# Patient Record
Sex: Male | Born: 1955 | Race: Black or African American | Hispanic: No | Marital: Single | Smoking: Former smoker
Health system: Southern US, Community
[De-identification: ages and names within clinical notes are randomized; demographics above are authoritative.]

## PROBLEM LIST (undated history)

## (undated) DIAGNOSIS — K8020 Calculus of gallbladder without cholecystitis without obstruction: Secondary | ICD-10-CM

## (undated) DIAGNOSIS — Z87442 Personal history of urinary calculi: Secondary | ICD-10-CM

## (undated) HISTORY — PX: CYSTOSCOPY: SUR368

## (undated) HISTORY — PX: BACK SURGERY: SHX140

## (undated) HISTORY — PX: CHOLECYSTECTOMY: SHX55

## (undated) HISTORY — PX: SMALL INTESTINE SURGERY: SHX150

---

## 2001-01-20 ENCOUNTER — Inpatient Hospital Stay (HOSPITAL_COMMUNITY): Admission: RE | Admit: 2001-01-20 | Discharge: 2001-01-22 | Payer: Self-pay | Admitting: Neurosurgery

## 2001-01-20 ENCOUNTER — Encounter: Payer: Self-pay | Admitting: Neurosurgery

## 2010-05-26 ENCOUNTER — Encounter: Admission: RE | Admit: 2010-05-26 | Discharge: 2010-05-26 | Payer: Self-pay | Admitting: Internal Medicine

## 2011-01-24 NOTE — H&P (Signed)
[place]. [facility]  Patient:    Curtis Craig, Curtis Craig                          MRN: [id number] Adm. Date:  [id number] Attending:  Coletta Memos                         History and Physical  ADMISSION DIAGNOSIS: Herniated disk at L2-3, and low back pain.  HISTORY OF PRESENT ILLNESS: [name] is a 55 year old right-handed gentleman who works as a Journalist, newspaper of [employer], who has had low back pain for the last three weeks.  The pain does not extend into his legs.  He has had pain similar to this about four to fives years ago.  He does have a long history of back pain.  He has had no bowel or bladder or sexual dysfunction.  There are no problems with strength.  Pain is described as very sharp, making it hard for him to get up at times.  Sitting with his back reclined against a vibrator is the only thing he has found to make himself semi-comfortable.  Vicodin tablets he has taken recently have only slowed him and make him feel out of sorts.  REVIEW OF SYSTEMS: Positive only for back pain.  He denies constitutional, eye, ear, nose, throat, mouth, cardiovascular, respiratory, gastrointestinal, genitourinary, skin, neurologic, psychiatric, endocrine, hematologic, or allergic problems.  He has no numbness or tingling in the lower extremities.  PAST SURGICAL HISTORY:  1. He underwent cholecystectomy in 2001.  2. Bowel obstruction surgery in 2000.  ALLERGIES: No known drug allergies.  CURRENT MEDICATIONS: None.  FAMILY HISTORY: Mother and father both deceased.  No medical problems noted in the family.  SOCIAL HISTORY: He says he smokes approximately once per week and drinks alcohol socially.  Does not use illicit drugs.  PHYSICAL EXAMINATION:  VITAL SIGNS: Height 5 feet 8 inches.  Weight 172 pounds.  Pulse 60.  GENERAL: Alert and oriented x 4.  Answers all questions appropriately. Memory, language, attention span, fund of knowledge are  normal.  He is well-kempt and in mild distress.  NEUROLOGIC: PERRL. EOMI.  Normal funduscopic examination.  Injected sclera, left eye; tearing in that eye also.  Symmetric facial sensation and symmetric facial movement.  Hearing intact to finger rub bilaterally.  Uvula elevates in the midline.  Shoulder shrug normal.  Tongue protrudes in the midline. Strength 5/5 in upper and lower extremities bilaterally.  Proprioception and pinprick normal in upper and lower extremities.  No patella reflex on the right, 2+ on the left.  Ankle jerk 2+ on the left, trace on the right. Reflexes 2+ at the biceps, triceps, and brachial radialis.  Toes are downgoing to plantar stimulation.  No clonus is appreciated.  CHEST: Lung fields are clear.  HEART: Regular rate and rhythm.  No murmurs or rubs.  Pulses good at wrists and feet bilaterally.  LABORATORY DATA: MRI shows a herniated disk at L2-3 along with desiccated disk at L2-3 and slight disk degeneration with free fragment extending rostrally behind the body of L2, most of this being on the right side; free fragment is in the middle below the clonus.  No other problems in the lumbar spine.  ASSESSMENT/PLAN: [name] is a 55 year old gentleman, who I think has back pain secondary to disk herniation.  We did discuss options including doing nothing, which I do not think  is reasonable as he is not able to work and he has a considerable amount of pain.  We could try epidural steroids, which also will not work as he has a large free fragment of disk which steroids would not do anything to improve.  Physical therapy could be tried, which I do not think is too useful because he is in so much pain right now.  We therefore did discuss surgery.  We discussed diskectomy and laminectomy.  He could be weak in his legs, bowel and bladder function could be disrupted, sexual function could be disrupted, or he may have a recurrence of the disk herniation.   He understands and would like to proceed with surgery. DD:  01/20/01 TD:  01/21/01 Job: [id number]

## 2011-01-24 NOTE — Op Note (Signed)
[place]. [facility]  Patient:    Curtis Craig, Curtis Craig                          MRN: [id number] Proc. Date: 01/20/01 Adm. Date:  [id number] Disc. Date: [id number] Attending:  Coletta Memos                           Operative Report  PREOPERATIVE DIAGNOSIS:  Displaced disk L2-3 and low back pain.  POSTOPERATIVE DIAGNOSIS:  Displaced disk L2-3 and low back pain.  OPERATION PERFORMED:  L2-3 semihemilaminectomy and diskectomy with microdissection.  COMPLICATIONS:  None.  SURGEON:  Kyle L. Franky Macho, M.D.  ASSISTANT:  None.  ANESTHESIA:  General.  INDICATIONS FOR PROCEDURE:  The patient is a 55 year old right-handed gentleman with a large herniated disk at L2-3 and low back pain.  I believe the disk and its free fragment is the reason for his low back pain.  DESCRIPTION OF PROCEDURE:  Mr. Irani was brought to the operating room intubated and placed under general anesthesia without difficulty.  I placed a spinal needle for localization and an x-ray was taken.  The x-ray appeared to be pointing to the spinous process of L2. Using that as a guide, I infiltrated 0.5% lidocaine and 1:200,000 strength 10 cc into the paraspinous soft tissue and underneath the skin.  I then draped the patient in sterile fashion.  I made a skin incision with a #10 blade and took this down to the thoracolumbar fascia.  I exposed the lamina of what I believed to be L2 and 3.  I took an x-ray that showed that my probe was placed inferior to the lamina of L1. Moving down one disk space I then proceeded with a semihemilaminectomy and diskectomy at L2.  This was done with a high speed air drill.  After exposing the thecal sac, I brought the microscope into the operative field and I then used it for microdissection of the L3 nerve root and the thecal sac.  I opened the disk space with a #15 blade and removed disk material from the disk space.  After that I was then able to probe underneath the  thecal sac with blunt hooks and removed a large free fragment of disk.  After that was done, I inspected to make sure that there was no loose disk material in the disk space after using Epstein curets and pituitary rongeurs.  I also inspected out in the neural foramen and again felt that there was no compression of the root. I irrigated the wound with saline.  I then closed the wound in layered fashion using Vicryl sutures.  Subcutaneous tissues and the thoracolumbar fascia were reapproximated with Vicryl sutures.  Dermabond was used for a sterile dressing.  The patient was then rolled supine, extubated moving all extremities well. DD:  01/22/01 TD:  01/25/01 Job: [id number]

## 2015-08-09 ENCOUNTER — Encounter (HOSPITAL_COMMUNITY)
Admission: RE | Admit: 2015-08-09 | Discharge: 2015-08-09 | Disposition: A | Discharge status: Home / Self Care | Payer: PRIVATE HEALTH INSURANCE | Source: Ambulatory Visit | Attending: General Surgery | Admitting: General Surgery

## 2015-08-09 ENCOUNTER — Encounter (HOSPITAL_COMMUNITY): Payer: Self-pay

## 2015-08-09 DIAGNOSIS — Z01818 Encounter for other preprocedural examination: Secondary | ICD-10-CM | POA: Insufficient documentation

## 2015-08-09 DIAGNOSIS — K4090 Unilateral inguinal hernia, without obstruction or gangrene, not specified as recurrent: Secondary | ICD-10-CM | POA: Diagnosis not present

## 2015-08-09 HISTORY — DX: Personal history of urinary calculi: Z87.442

## 2015-08-09 LAB — CBC WITH DIFFERENTIAL/PLATELET
BASOS ABS: 0 10*3/uL (ref 0.0–0.1)
Basophils Relative: 1 %
EOS ABS: 0.3 10*3/uL (ref 0.0–0.7)
EOS PCT: 4 %
HCT: 37 % — ABNORMAL LOW (ref 39.0–52.0)
Hemoglobin: 12 g/dL — ABNORMAL LOW (ref 13.0–17.0)
Lymphocytes Relative: 39 %
Lymphs Abs: 2.6 10*3/uL (ref 0.7–4.0)
MCH: 28.2 pg (ref 26.0–34.0)
MCHC: 32.4 g/dL (ref 30.0–36.0)
MCV: 86.9 fL (ref 78.0–100.0)
Monocytes Absolute: 0.5 10*3/uL (ref 0.1–1.0)
Monocytes Relative: 8 %
Neutro Abs: 3.3 10*3/uL (ref 1.7–7.7)
Neutrophils Relative %: 48 %
PLATELETS: 336 10*3/uL (ref 150–400)
RBC: 4.26 MIL/uL (ref 4.22–5.81)
RDW: 13.2 % (ref 11.5–15.5)
WBC: 6.7 10*3/uL (ref 4.0–10.5)

## 2015-08-09 LAB — BASIC METABOLIC PANEL
Anion gap: 7 (ref 5–15)
BUN: 10 mg/dL (ref 6–20)
CALCIUM: 9.3 mg/dL (ref 8.9–10.3)
CO2: 25 mmol/L (ref 22–32)
CREATININE: 0.95 mg/dL (ref 0.61–1.24)
Chloride: 108 mmol/L (ref 101–111)
GFR calc Af Amer: >60 mL/min (ref >60)
Glucose, Bld: 89 mg/dL (ref 65–99)
Potassium: 4.1 mmol/L (ref 3.5–5.1)
SODIUM: 140 mmol/L (ref 135–145)

## 2015-08-09 NOTE — Pre-Procedure Instructions (Signed)
Patient given information to sign up for my chart at home. 

## 2015-08-09 NOTE — Patient Instructions (Signed)
Lemon Sternberg  08/09/2015     @   Your procedure is scheduled on  08/13/2015   Report to [facility] at  725  A.M.  Call this number if you have problems the morning of surgery:  [phone]   Remember:  Do not eat food or drink liquids after midnight.  Take these medicines the morning of surgery with A SIP OF WATER  none   Do not wear jewelry, make-up or nail polish.  Do not wear lotions, powders, or perfumes.  You may wear deodorant.  Do not shave 48 hours prior to surgery.  Men may shave face and neck.  Do not bring valuables to the hospital.  [facility] is not responsible for any belongings or valuables.  Contacts, dentures or bridgework may not be worn into surgery.  Leave your suitcase in the car.  After surgery it may be brought to your room.  For patients admitted to the hospital, discharge time will be determined by your treatment team.  Patients discharged the day of surgery will not be allowed to drive home.   Name and phone number of your driver:   family Special instructions:  none  Please read over the following fact sheets that you were given. Pain Booklet, Coughing and Deep Breathing, Surgical Site Infection Prevention, Anesthesia Post-op Instructions and Care and Recovery After Surgery      Hernia, Adult A hernia is the bulging of an organ or tissue through a weak spot in the muscles of the abdomen (abdominal wall). Hernias develop most often near the navel or groin. There are many kinds of hernias. Common kinds include:  Femoral hernia. This kind of hernia develops under the groin in the upper thigh area.  Inguinal hernia. This kind of hernia develops in the groin or scrotum.  Umbilical hernia. This kind of hernia develops near the navel.  Hiatal hernia. This kind of hernia causes part of the stomach to be pushed up into the chest.  Incisional hernia. This kind of hernia bulges through a scar from an abdominal  surgery. CAUSES This condition may be caused by:  Heavy lifting.  Coughing over a long period of time.  Straining to have a bowel movement.  An incision made during an abdominal surgery.  A birth defect (congenital defect).  Excess weight or obesity.  Smoking.  Poor nutrition.  Cystic fibrosis.  Excess fluid in the abdomen.  Undescended testicles. SYMPTOMS Symptoms of a hernia include:  A lump on the abdomen. This is the first sign of a hernia. The lump may become more obvious with standing, straining, or coughing. It may get bigger over time if it is not treated or if the condition causing it is not treated.  Pain. A hernia is usually painless, but it may become painful over time if treatment is delayed. The pain is usually dull and may get worse with standing or lifting heavy objects. Sometimes a hernia gets tightly squeezed in the weak spot (strangulated) or stuck there (incarcerated) and causes additional symptoms. These symptoms may include:  Vomiting.  Nausea.  Constipation.  Irritability. DIAGNOSIS A hernia may be diagnosed with:  A physical exam. During the exam your health care provider may ask you to cough or to make a specific movement, because a hernia is usually more visible when you move.  Imaging tests. These can include:  X-rays.  Ultrasound.  CT scan. TREATMENT A hernia that is small and painless may not  need to be treated. A hernia that is large or painful may be treated with surgery. Inguinal hernias may be treated with surgery to prevent incarceration or strangulation. Strangulated hernias are always treated with surgery, because lack of blood to the trapped organ or tissue can cause it to die. Surgery to treat a hernia involves pushing the bulge back into place and repairing the weak part of the abdomen. HOME CARE INSTRUCTIONS  Avoid straining.  Do not lift anything heavier than 10 lb (4.5 kg).  Lift with your leg muscles, not your  back muscles. This helps avoid strain.  When coughing, try to cough gently.  Prevent constipation. Constipation leads to straining with bowel movements, which can make a hernia worse or cause a hernia repair to break down. You can prevent constipation by:  Eating a high-fiber diet that includes plenty of fruits and vegetables.  Drinking enough fluids to keep your urine clear or pale yellow. Aim to drink 6-8 glasses of water per day.  Using a stool softener as directed by your health care provider.  Lose weight, if you are overweight.  Do not use any tobacco products, including cigarettes, chewing tobacco, or electronic cigarettes. If you need help quitting, ask your health care provider.  Keep all follow-up visits as directed by your health care provider. This is important. Your health care provider may need to monitor your condition. SEEK MEDICAL CARE IF:  You have swelling, redness, and pain in the affected area.  Your bowel habits change. SEEK IMMEDIATE MEDICAL CARE IF:  You have a fever.  You have abdominal pain that is getting worse.  You feel nauseous or you vomit.  You cannot push the hernia back in place by gently pressing on it while you are lying down.  The hernia:  Changes in shape or size.  Is stuck outside the abdomen.  Becomes discolored.  Feels hard or tender.   This information is not intended to replace advice given to you by your health care provider. Make sure you discuss any questions you have with your health care provider.   Document Released: 08/25/2005 Document Revised: 09/15/2014 Document Reviewed: 07/05/2014 Elsevier Interactive Patient Education 2016 [employer] PATIENT INSTRUCTIONS POST-ANESTHESIA  IMMEDIATELY FOLLOWING SURGERY:  Do not drive or operate machinery for the first twenty four hours after surgery.  Do not make any important decisions for twenty four hours after surgery or while taking narcotic pain medications or sedatives.   If you develop intractable nausea and vomiting or a severe headache please notify your doctor immediately.  FOLLOW-UP:  Please make an appointment with your surgeon as instructed. You do not need to follow up with anesthesia unless specifically instructed to do so.  WOUND CARE INSTRUCTIONS (if applicable):  Keep a dry clean dressing on the anesthesia/puncture wound site if there is drainage.  Once the wound has quit draining you may leave it open to air.  Generally you should leave the bandage intact for twenty four hours unless there is drainage.  If the epidural site drains for more than 36-48 hours please call the anesthesia department.  QUESTIONS?:  Please feel free to call your physician or the hospital operator if you have any questions, and they will be happy to assist you.

## 2015-08-10 NOTE — H&P (Signed)
  NTS SOAP Note  Vital Signs:  Vitals as of: 08/09/2015: Systolic 157: Diastolic 88: Heart Rate 65: Temp 7F: Height 855ft 9in: Weight 187Lbs 0 Ounces: Pain Level 10: BMI 27.61  BMI : 27.61 kg/m2  Subjective: This 59 year old male presents for of a right inguinal hernia.  Has had pain in that region for the past few weeks, is worsening.  Made worse with straining.  Review of Symptoms:  Constitutional:unremarkable   Head:unremarkable Eyes:unremarkable   Nose/Mouth/Throat:unremarkable Cardiovascular:  unremarkable Respiratory:unremarkable Gastrointestinal:  unremarkable   Genitourinary:unremarkable   Musculoskeletal:unremarkable Skin:unremarkable Hematolgic/Lymphatic:unremarkable   Allergic/Immunologic:unremarkable   Past Medical History:  Reviewed  Past Medical History  Surgical History: abdominal surgery, cholecystectomy, appendectomy Medical Problems: none Allergies: nkda Medications: hydrocodone   Social History:Reviewed  Social History  Preferred Language: English Race:  Black or African American Ethnicity: Not Hispanic / Latino Age: [age] year Marital Status:  S Alcohol: rarely   Smoking Status: Never smoker reviewed on 08/09/2015 Functional Status reviewed on 08/09/2015 ------------------------------------------------ Bathing: Normal Cooking: Normal Dressing: Normal Driving: Normal Eating: Normal Managing Meds: Normal Oral Care: Normal Shopping: Normal Toileting: Normal Transferring: Normal Walking: Normal Cognitive Status reviewed on 08/09/2015 ------------------------------------------------ Attention: Normal Decision Making: Normal Language: Normal Memory: Normal Motor: Normal Perception: Normal Problem Solving: Normal Visual and Spatial: Normal   Family History:Reviewed  Family Health History Family History is Unknown    Objective Information: General:Well appearing, well nourished in no  distress. Heart:RRR, no murmur or gallop.  Normal S1, S2.  No S3, S4.  Lungs:  CTA bilaterally, no wheezes, rhonchi, rales.  Breathing unlabored. Abdomen:Soft, NT/ND, no HSM, no masses.  Small reducible right inguinal hernia.  No left inguinal hernia. WU:[id number]:unremarkable    Assessment:Right inguinal hernia  Diagnoses: 550.90  K40.90 Inguinal hernia (Unilateral inguinal hernia, without obstruction or gangrene, not specified as recurrent)  Procedures: [id number] - OFFICE OUTPATIENT NEW 30 MINUTES    Plan:  Scheduled for right inguinal herniorrhaphy with mesh on 08/13/15.   Patient Education:Alternative treatments to surgery were discussed with patient (and family).  Risks and benefits  of procedure including bleedking, infection, mesh use, and recurrence of the hernia were fully explained to the patient (and family) who gave informed consent. Patient/family questions were addressed.  Follow-up:Pending Surgery

## 2015-08-13 ENCOUNTER — Ambulatory Visit (HOSPITAL_COMMUNITY): Payer: PRIVATE HEALTH INSURANCE | Admitting: Anesthesiology

## 2015-08-13 ENCOUNTER — Ambulatory Visit (HOSPITAL_COMMUNITY)
Admission: RE | Admit: 2015-08-13 | Discharge: 2015-08-13 | Disposition: A | Discharge status: Home / Self Care | Payer: PRIVATE HEALTH INSURANCE | Source: Ambulatory Visit | Attending: General Surgery | Admitting: General Surgery

## 2015-08-13 ENCOUNTER — Encounter (HOSPITAL_COMMUNITY)
Admission: RE | Disposition: A | Discharge status: Home / Self Care | Payer: Self-pay | Source: Ambulatory Visit | Attending: General Surgery

## 2015-08-13 ENCOUNTER — Encounter (HOSPITAL_COMMUNITY): Payer: Self-pay | Admitting: *Deleted

## 2015-08-13 DIAGNOSIS — K4090 Unilateral inguinal hernia, without obstruction or gangrene, not specified as recurrent: Secondary | ICD-10-CM | POA: Diagnosis present

## 2015-08-13 HISTORY — DX: Calculus of gallbladder without cholecystitis without obstruction: K80.20

## 2015-08-13 HISTORY — PX: INSERTION OF MESH: SHX5868

## 2015-08-13 HISTORY — PX: INGUINAL HERNIA REPAIR: SHX194

## 2015-08-13 SURGERY — REPAIR, HERNIA, INGUINAL, ADULT
Anesthesia: General | Laterality: Right

## 2015-08-13 MED ORDER — LACTATED RINGERS IV SOLN
INTRAVENOUS | Status: DC
  Administered 2015-08-13: 1000 mL via INTRAVENOUS

## 2015-08-13 MED ORDER — FENTANYL CITRATE (PF) 100 MCG/2ML IJ SOLN
INTRAMUSCULAR | Status: AC
  Filled 2015-08-13: qty 2

## 2015-08-13 MED ORDER — KETOROLAC TROMETHAMINE 30 MG/ML IJ SOLN
INTRAMUSCULAR | Status: AC
  Filled 2015-08-13: qty 1

## 2015-08-13 MED ORDER — FENTANYL CITRATE (PF) 100 MCG/2ML IJ SOLN
25.0000 ug | Freq: Once | INTRAMUSCULAR | Status: AC
  Administered 2015-08-13: 25 ug via INTRAVENOUS

## 2015-08-13 MED ORDER — FENTANYL CITRATE (PF) 100 MCG/2ML IJ SOLN
25.0000 ug | INTRAMUSCULAR | Status: DC | PRN
  Administered 2015-08-13 (×2): 50 ug via INTRAVENOUS
  Filled 2015-08-13: qty 2

## 2015-08-13 MED ORDER — LIDOCAINE HCL (CARDIAC) 20 MG/ML IV SOLN
INTRAVENOUS | Status: DC | PRN
  Administered 2015-08-13: 50 mg via INTRAVENOUS

## 2015-08-13 MED ORDER — ONDANSETRON HCL 4 MG/2ML IJ SOLN
4.0000 mg | Freq: Once | INTRAMUSCULAR | Status: AC
  Administered 2015-08-13: 4 mg via INTRAVENOUS

## 2015-08-13 MED ORDER — CHLORHEXIDINE GLUCONATE 4 % EX LIQD: 1.0000 "application " | Freq: Once | CUTANEOUS | Status: DC

## 2015-08-13 MED ORDER — PROPOFOL 10 MG/ML IV BOLUS
INTRAVENOUS | Status: AC
  Filled 2015-08-13: qty 20

## 2015-08-13 MED ORDER — FENTANYL CITRATE (PF) 100 MCG/2ML IJ SOLN
INTRAMUSCULAR | Status: DC | PRN
  Administered 2015-08-13: 50 ug via INTRAVENOUS
  Administered 2015-08-13 (×2): 25 ug via INTRAVENOUS

## 2015-08-13 MED ORDER — SODIUM CHLORIDE 0.9 % IR SOLN
Status: DC | PRN
  Administered 2015-08-13: 1000 mL

## 2015-08-13 MED ORDER — BUPIVACAINE LIPOSOME 1.3 % IJ SUSP
INTRAMUSCULAR | Status: DC | PRN
  Administered 2015-08-13: 20 mL

## 2015-08-13 MED ORDER — LACTATED RINGERS IV SOLN
INTRAVENOUS | Status: DC | PRN
  Administered 2015-08-13: 08:00:00 via INTRAVENOUS

## 2015-08-13 MED ORDER — MIDAZOLAM HCL 2 MG/2ML IJ SOLN
INTRAMUSCULAR | Status: AC
  Filled 2015-08-13: qty 2

## 2015-08-13 MED ORDER — ONDANSETRON HCL 4 MG/2ML IJ SOLN: 4.0000 mg | Freq: Once | INTRAMUSCULAR | Status: DC | PRN

## 2015-08-13 MED ORDER — BUPIVACAINE LIPOSOME 1.3 % IJ SUSP
INTRAMUSCULAR | Status: AC
  Filled 2015-08-13: qty 20

## 2015-08-13 MED ORDER — FENTANYL CITRATE (PF) 100 MCG/2ML IJ SOLN
25.0000 ug | INTRAMUSCULAR | Status: AC
  Administered 2015-08-13 (×2): 25 ug via INTRAVENOUS

## 2015-08-13 MED ORDER — OXYCODONE-ACETAMINOPHEN 7.5-325 MG PO TABS: 1.0000 | ORAL_TABLET | ORAL | Status: DC | PRN

## 2015-08-13 MED ORDER — KETOROLAC TROMETHAMINE 30 MG/ML IJ SOLN
30.0000 mg | Freq: Once | INTRAMUSCULAR | Status: AC
  Administered 2015-08-13: 30 mg via INTRAVENOUS

## 2015-08-13 MED ORDER — ONDANSETRON HCL 4 MG/2ML IJ SOLN
INTRAMUSCULAR | Status: AC
  Filled 2015-08-13: qty 2

## 2015-08-13 MED ORDER — MIDAZOLAM HCL 2 MG/2ML IJ SOLN
1.0000 mg | INTRAMUSCULAR | Status: DC | PRN
  Administered 2015-08-13: 2 mg via INTRAVENOUS

## 2015-08-13 MED ORDER — CEFAZOLIN SODIUM-DEXTROSE 2-3 GM-% IV SOLR
INTRAVENOUS | Status: AC
  Filled 2015-08-13: qty 50

## 2015-08-13 MED ORDER — EPHEDRINE SULFATE 50 MG/ML IJ SOLN
INTRAMUSCULAR | Status: AC
  Filled 2015-08-13: qty 1

## 2015-08-13 MED ORDER — PROPOFOL 10 MG/ML IV BOLUS
INTRAVENOUS | Status: DC | PRN
  Administered 2015-08-13: 140 mg via INTRAVENOUS

## 2015-08-13 MED ORDER — SODIUM CHLORIDE 0.9 % IJ SOLN
INTRAMUSCULAR | Status: AC
  Filled 2015-08-13: qty 10

## 2015-08-13 MED ORDER — CEFAZOLIN SODIUM-DEXTROSE 2-3 GM-% IV SOLR
2.0000 g | INTRAVENOUS | Status: AC
  Administered 2015-08-13: 2 g via INTRAVENOUS

## 2015-08-13 SURGICAL SUPPLY — 35 items
ADH SKN CLS APL DERMABOND .7 (GAUZE/BANDAGES/DRESSINGS) ×1
BAG HAMPER (MISCELLANEOUS) ×3 IMPLANT
CLOTH BEACON ORANGE TIMEOUT ST (SAFETY) ×3 IMPLANT
COVER LIGHT HANDLE STERIS (MISCELLANEOUS) ×6 IMPLANT
DERMABOND ADVANCED (GAUZE/BANDAGES/DRESSINGS) ×2
DERMABOND ADVANCED .7 DNX12 (GAUZE/BANDAGES/DRESSINGS) ×1 IMPLANT
DRAIN PENROSE 18X1/2 LTX STRL (DRAIN) ×3 IMPLANT
ELECT REM PT RETURN 9FT ADLT (ELECTROSURGICAL) ×3
ELECTRODE REM PT RTRN 9FT ADLT (ELECTROSURGICAL) ×1 IMPLANT
GLOVE BIOGEL M 7.0 STRL (GLOVE) ×4 IMPLANT
GLOVE BIOGEL PI IND STRL 7.0 (GLOVE) ×1 IMPLANT
GLOVE BIOGEL PI INDICATOR 7.0 (GLOVE) ×8
GLOVE SURG SS PI 7.5 STRL IVOR (GLOVE) ×3 IMPLANT
GOWN STRL REUS W/ TWL XL LVL3 (GOWN DISPOSABLE) ×1 IMPLANT
GOWN STRL REUS W/TWL LRG LVL3 (GOWN DISPOSABLE) ×6 IMPLANT
GOWN STRL REUS W/TWL XL LVL3 (GOWN DISPOSABLE) ×3
INST SET MINOR GENERAL (KITS) ×3 IMPLANT
KIT ROOM TURNOVER APOR (KITS) ×3 IMPLANT
MANIFOLD NEPTUNE II (INSTRUMENTS) ×3 IMPLANT
MESH MARLEX PLUG MEDIUM (Mesh General) ×2 IMPLANT
NDL HYPO 21X1.5 SAFETY (NEEDLE) ×1 IMPLANT
NEEDLE HYPO 21X1.5 SAFETY (NEEDLE) ×3 IMPLANT
NS IRRIG 1000ML POUR BTL (IV SOLUTION) ×3 IMPLANT
PACK MINOR (CUSTOM PROCEDURE TRAY) ×3 IMPLANT
PAD ARMBOARD 7.5X6 YLW CONV (MISCELLANEOUS) ×3 IMPLANT
SCRUB PCMX 4 OZ (MISCELLANEOUS) ×3 IMPLANT
SET BASIN LINEN APH (SET/KITS/TRAYS/PACK) ×3 IMPLANT
SUT NOVA NAB GS-22 2 2-0 T-19 (SUTURE) ×4 IMPLANT
SUT VIC AB 2-0 CT1 27 (SUTURE) ×3
SUT VIC AB 2-0 CT1 TAPERPNT 27 (SUTURE) ×1 IMPLANT
SUT VIC AB 3-0 SH 27 (SUTURE) ×3
SUT VIC AB 3-0 SH 27X BRD (SUTURE) ×1 IMPLANT
SUT VIC AB 4-0 PS2 27 (SUTURE) ×3 IMPLANT
SUT VICRYL AB 3 0 TIES (SUTURE) ×3 IMPLANT
SYR 20CC LL (SYRINGE) ×3 IMPLANT

## 2015-08-13 NOTE — Anesthesia Postprocedure Evaluation (Signed)
Anesthesia Post Note Late Entry  Patient: Curtis Craig  Procedure(s) Performed: Procedure(s) (LRB): RIGHT INGUINAL HERNIORRHAPHY WITH MESH (Right) INSERTION OF MESH RIGHT INGUINAL HERNIORRHAPY (Right)  Patient location during evaluation: PACU Anesthesia Type: General Level of consciousness: awake and alert Pain management: pain level controlled Vital Signs Assessment: post-procedure vital signs reviewed and stable Respiratory status: respiratory function stable Cardiovascular status: stable Postop Assessment: no signs of nausea or vomiting Anesthetic complications: no    Last Vitals:  Filed Vitals:   08/13/15 1030 08/13/15 1045  BP: 117/77   Pulse: 65 66  Temp:    Resp: 17 16    Last Pain:  Filed Vitals:   08/13/15 1046  PainSc: 3                  [name] A

## 2015-08-13 NOTE — Transfer of Care (Signed)
Immediate Anesthesia Transfer of Care Note  Patient: Curtis Craig  Procedure(s) Performed: Procedure(s): RIGHT INGUINAL HERNIORRHAPHY WITH MESH (Right) INSERTION OF MESH RIGHT INGUINAL HERNIORRHAPY (Right)  Patient Location: PACU  Anesthesia Type:General  Level of Consciousness: awake, oriented and patient cooperative  Airway & Oxygen Therapy: Patient Spontanous Breathing and Patient connected to face mask oxygen  Post-op Assessment: Report given to RN and Post -op Vital signs reviewed and stable  Post vital signs: Reviewed and stable  Last Vitals:  Filed Vitals:   08/13/15 0829 08/13/15 0830  BP: 113/68 113/68  Pulse:    Temp:    Resp: 66 16    Complications: No apparent anesthesia complications

## 2015-08-13 NOTE — Op Note (Signed)
Patient:  Curtis Craig  DOB:  09-Nov-1955  MRN:  [id number]   Preop Diagnosis:  Right inguinal hernia  Postop Diagnosis:  Same  Procedure:  Right inguinal herniorrhaphy with mesh  Surgeon:  Franky MachoMark Jasiya Markie, M.D.  Anes:  Gen.  Indications:  Patient is a 59 year old black male who presents with a symptomatic right inguinal hernia. The risks and benefits of the procedure including bleeding, infection, mesh use, and the possibility of recurrence of the hernia were fully explained to the patient, who gave informed consent.  Procedure note:  The patient was placed the supine position. After general anesthesia was administered, the right groin region was prepped and draped using usual sterile technique with CBG. Surgical site confirmation was performed.  A right groin incision was made down to the external oblique aponeuroses. The aponeuroses was incised to the external ring. A Penrose drain was placed around the spermatic cord. A vas deferens was noted within the spermatic cord. Indirect hernia sac was found. This was freed away from the spermatic cord up to the peritoneal reflection and inverted. A medium size Bard PerFix plug was then inserted. An onlay patch was then placed along the floor of the inguinal canal and secured superiorly to the conjoined tendon and inferiorly to the shelving edge of Poupart's ligament using 2-0 Novafil interrupted sutures. The internal ring was recreated using a 2-0 Novafil interrupted suture. The external oblique aponeuroses was reapproximated using a 2-0 Vicryl running suture. The subcutaneous layer was reapproximated using 3-0 Vicryl interrupted sutures. Exparel was instilled into the surrounding wound. The skin was closed using a 4-0 Vicryl subcuticular suture. Dermabond was applied.  All tape and needle counts were correct at the end of the procedure. Patient was awakened and transferred to PACU in stable condition.  Complications:  None  EBL:   Minimal  Specimen:  None

## 2015-08-13 NOTE — Anesthesia Preprocedure Evaluation (Addendum)
Anesthesia Evaluation  Patient identified by MRN, date of birth, ID band Patient awake    Reviewed: Allergy & Precautions, NPO status , Patient's Chart, lab work & pertinent test results  Airway Mallampati: II  TM Distance: >3 FB     Dental  (+) Teeth Intact, Dental Advisory Given   Pulmonary former smoker,    breath sounds clear to auscultation       Cardiovascular negative cardio ROS   Rhythm:Regular Rate:Normal     Neuro/Psych    GI/Hepatic negative GI ROS,   Endo/Other    Renal/GU      Musculoskeletal   Abdominal   Peds  Hematology   Anesthesia Other Findings   Reproductive/Obstetrics                            Anesthesia Physical Anesthesia Plan  ASA: I  Anesthesia Plan: General   Post-op Pain Management:    Induction: Intravenous  Airway Management Planned: LMA  Additional Equipment:   Intra-op Plan:   Post-operative Plan: Extubation in OR  Informed Consent: I have reviewed the patients History and Physical, chart, labs and discussed the procedure including the risks, benefits and alternatives for the proposed anesthesia with the patient or authorized representative who has indicated his/her understanding and acceptance.     Plan Discussed with:   Anesthesia Plan Comments:         Anesthesia Quick Evaluation

## 2015-08-13 NOTE — Discharge Instructions (Signed)
Open Hernia Repair, Care After °Refer to this sheet in the next few weeks. These instructions provide you with information on caring for yourself after your procedure. Your health care provider may also give you more specific instructions. Your treatment has been planned according to current medical practices, but problems sometimes occur. Call your health care provider if you have any problems or questions after your procedure. °WHAT TO EXPECT AFTER THE PROCEDURE °After your procedure, it is typical to have the following: °· Pain in your abdomen, especially along your incision. You will be given pain medicines to control the pain. °· Constipation. You may be given a stool softener to help prevent this. °HOME CARE INSTRUCTIONS °· Only take over-the-counter or prescription medicines as directed by your health care provider. °· Keep the incision area dry and clean. You may wash the incision area gently with soap and water 48 hours after surgery. Gently blot or dab the incision area dry. Do not take baths, use swimming pools, or use hot tubs for 10 days or until your health care provider approves. °· Change bandages (dressings) as directed by your health care provider. °· Continue your normal diet as directed by your health care provider. Eat plenty of fruits and vegetables to help prevent constipation. °· Drink enough fluids to keep your urine clear or pale yellow. This also helps prevent constipation. °· Do not drive until your health care provider says it is okay. °· Do not lift anything heavier than 10 lb (4.5 kg) or play contact sports for 4 weeks or until your health care provider approves. °· Follow up with your health care provider as directed. Ask your health care provider when to make an appointment to have your stitches (sutures) or staples removed. °SEEK MEDICAL CARE IF: °· You have increased bleeding coming from the incision site. °· You have blood in your stool. °· You have increasing pain in the incision  area. °· You see redness or swelling in the incision area. °· You have fluid (pus) coming from the incision. °· You have a fever. °· You notice a bad smell coming from the incision area or dressing. °SEEK IMMEDIATE MEDICAL CARE IF: °· You develop a rash. °· You have chest pain or shortness of breath. °· You feel lightheaded or feel faint. °  °This information is not intended to replace advice given to you by your health care provider. Make sure you discuss any questions you have with your health care provider. °  °Document Released: 03/14/2005 Document Revised: 09/15/2014 Document Reviewed: 04/06/2013 °Elsevier Interactive Patient Education ©2016 [employer]vier Inc. ° °

## 2015-08-13 NOTE — Interval H&P Note (Signed)
History and Physical Interval Note:  08/13/2015 8:25 AM  Curtis Craig  has presented today for surgery, with the diagnosis of right inguinal hernia  The various methods of treatment have been discussed with the patient and family. After consideration of risks, benefits and other options for treatment, the patient has consented to  Procedure(s): RIGHT INGUINAL HERNIORRHAPHY WITH MESH (Right) INSERTION OF MESH RIGHT INGUINAL HERNIORRHAPY (Right) as a surgical intervention .  The patient's history has been reviewed, patient examined, no change in status, stable for surgery.  I have reviewed the patient's chart and labs.  Questions were answered to the patient's satisfaction.     Franky MachoJENKINS,Jase Himmelberger A

## 2015-08-13 NOTE — Anesthesia Procedure Notes (Signed)
Procedure Name: LMA Insertion Date/Time: 08/13/2015 9:03 AM Performed by: Pernell DupreADAMS, Cecila Satcher A Pre-anesthesia Checklist: Patient identified, Timeout performed, Emergency Drugs available, Suction available and Patient being monitored Patient Re-evaluated:Patient Re-evaluated prior to inductionOxygen Delivery Method: Circle system utilized Preoxygenation: Pre-oxygenation with 100% oxygen Intubation Type: IV induction Ventilation: Mask ventilation without difficulty LMA: LMA inserted LMA Size: 5.0 Number of attempts: 1 Placement Confirmation: positive ETCO2 and breath sounds checked- equal and bilateral Tube secured with: Tape Dental Injury: Teeth and Oropharynx as per pre-operative assessment

## 2015-08-14 ENCOUNTER — Encounter (HOSPITAL_COMMUNITY): Payer: Self-pay | Admitting: General Surgery

## 2017-05-12 ENCOUNTER — Ambulatory Visit (INDEPENDENT_AMBULATORY_CARE_PROVIDER_SITE_OTHER): Payer: PRIVATE HEALTH INSURANCE | Admitting: General Surgery

## 2017-05-12 ENCOUNTER — Encounter: Payer: Self-pay | Admitting: General Surgery

## 2017-05-12 VITALS — BP 146/79 | HR 70 | Temp 98.6°F | Resp 18 | Ht 69.0 in | Wt 191.0 lb

## 2017-05-12 DIAGNOSIS — R1031 Right lower quadrant pain: Secondary | ICD-10-CM | POA: Diagnosis not present

## 2017-05-12 MED ORDER — GABAPENTIN 300 MG PO CAPS: 300.0000 mg | ORAL_CAPSULE | Freq: Three times a day (TID) | ORAL | 0 refills | Status: DC

## 2017-05-12 NOTE — Progress Notes (Signed)
Curtis Craig; [id number]; 04/23/1956   HPI Patient is a 61 year old black male status post right inguinal herniorrhaphy with mesh 2016 who was referred to my care for evaluation of suprapubic pain. He states that she started 1 week ago. He does have a history of kidney stones. He states the pain seems to be localized to the suprapubic region. He has no back or flank pain. He states that he has not had any problems since his hernia repair. He was seen in the emergency room at [facility], [place]. CT scan of the abdomen revealed a kidney stone in the right renal pelvis. No hernia was appreciated. He states he has not noticed a bulge. He has not had dysuria. He has 10 out of 10 pain. Past Medical History:  Diagnosis Date  . Gall bladder stones   . History of kidney stones     Past Surgical History:  Procedure Laterality Date  . BACK SURGERY     lumbar  . CHOLECYSTECTOMY    . CYSTOSCOPY     stone extraction  . INGUINAL HERNIA REPAIR Right 08/13/2015   Procedure: RIGHT INGUINAL HERNIORRHAPHY WITH MESH;  Surgeon: Franky Macho, MD;  Location: [facility];  Service: General;  Laterality: Right;  . INSERTION OF MESH Right 08/13/2015   Procedure: INSERTION OF MESH RIGHT INGUINAL HERNIORRHAPY;  Surgeon: Franky Macho, MD;  Location: [facility];  Service: General;  Laterality: Right;  . SMALL INTESTINE SURGERY      No family history on file.  Current Outpatient Prescriptions on File Prior to Visit  Medication Sig Dispense Refill  . oxyCODONE-acetaminophen (PERCOCET) 7.5-325 MG tablet Take 1-2 tablets by mouth every 4 (four) hours as needed for severe pain. 50 tablet 0   No current facility-administered medications on file prior to visit.     No Known Allergies  History  Alcohol Use  . Yes    Comment: rare    History  Smoking Status  . Former Smoker  . Packs/day: 0.25  . Years: 1.00  . Types: Cigarettes  . Quit date: 08/08/1982  Smokeless Tobacco  . Never Used    Review of Systems   Constitutional: Negative.   HENT: Negative.   Eyes: Negative.   Cardiovascular: Negative.   Gastrointestinal: Negative.   Genitourinary: Negative.   Musculoskeletal: Negative.   Skin: Negative.   Neurological: Negative.   Endo/Heme/Allergies: Negative.   Psychiatric/Behavioral: Negative.     Objective   Vitals:   05/12/17 1350  BP: (!) 146/79  Pulse: 70  Resp: 18  Temp: 98.6 F (37 C)    Physical Exam  Constitutional: He is oriented to person, place, and time and well-developed, well-nourished, and in no distress.  HENT:  Head: Normocephalic and atraumatic.  Cardiovascular: Normal rate, regular rhythm and normal heart sounds.  Exam reveals no gallop and no friction rub.   No murmur heard. Pulmonary/Chest: Effort normal and breath sounds normal. No respiratory distress. He has no wheezes. He has no rales.  Abdominal: Soft. Bowel sounds are normal. He exhibits no distension. There is no rebound.  Have some point tenderness in the suprapubic region, along the inferior aspect of the lower midline incision. I could not appreciate a hernia. He has no specific pain in the right groin over the right inguinal repair site.  Neurological: He is alert and oriented to person, place, and time.  Skin: Skin is warm and dry.  Vitals reviewed.   Assessment  Lower abdomen, right groin pain of unknown etiology. Probable muscular  strain. No hernias are present. Plan   I told patient that I could not find any hernia that could be causing his pain. Though he does not have any dysuria, a kidney stone in the bladder should also be considered. I told him I do not have anything to offer him at this time. He states he is going to see what happens, and may go back to his primary care physician should the pain persist.

## 2018-01-31 ENCOUNTER — Other Ambulatory Visit: Payer: Self-pay

## 2018-01-31 ENCOUNTER — Emergency Department (HOSPITAL_COMMUNITY): Payer: PRIVATE HEALTH INSURANCE

## 2018-01-31 ENCOUNTER — Emergency Department (HOSPITAL_COMMUNITY)
Admission: EM | Admit: 2018-01-31 | Discharge: 2018-02-01 | Disposition: A | Discharge status: Home / Self Care | Payer: PRIVATE HEALTH INSURANCE | Attending: Emergency Medicine | Admitting: Emergency Medicine

## 2018-01-31 ENCOUNTER — Encounter (HOSPITAL_COMMUNITY): Payer: Self-pay | Admitting: Emergency Medicine

## 2018-01-31 DIAGNOSIS — R059 Cough, unspecified: Secondary | ICD-10-CM

## 2018-01-31 DIAGNOSIS — Z87891 Personal history of nicotine dependence: Secondary | ICD-10-CM | POA: Insufficient documentation

## 2018-01-31 DIAGNOSIS — R05 Cough: Secondary | ICD-10-CM | POA: Diagnosis present

## 2018-01-31 DIAGNOSIS — M25512 Pain in left shoulder: Secondary | ICD-10-CM | POA: Diagnosis not present

## 2018-01-31 DIAGNOSIS — J40 Bronchitis, not specified as acute or chronic: Secondary | ICD-10-CM | POA: Insufficient documentation

## 2018-01-31 MED ORDER — TRAMADOL HCL 50 MG PO TABS: 50.0000 mg | ORAL_TABLET | Freq: Four times a day (QID) | ORAL | 0 refills | Status: AC | PRN

## 2018-01-31 MED ORDER — HYDROMORPHONE HCL 1 MG/ML IJ SOLN
1.0000 mg | Freq: Once | INTRAMUSCULAR | Status: AC
  Administered 2018-01-31: 1 mg via INTRAMUSCULAR
  Filled 2018-01-31: qty 1

## 2018-01-31 MED ORDER — DOXYCYCLINE HYCLATE 100 MG PO TABS
100.0000 mg | ORAL_TABLET | Freq: Once | ORAL | Status: AC
  Administered 2018-01-31: 100 mg via ORAL
  Filled 2018-01-31: qty 1

## 2018-01-31 MED ORDER — DOXYCYCLINE HYCLATE 100 MG PO CAPS: 100.0000 mg | ORAL_CAPSULE | Freq: Two times a day (BID) | ORAL | 0 refills | Status: AC

## 2018-01-31 NOTE — ED Triage Notes (Addendum)
Cough, congestion, runny nose and lt arm pain with movement since yesterday

## 2018-01-31 NOTE — Discharge Instructions (Addendum)
Follow-up with your family doctor next week drink plenty of fluids.  Take Tylenol for fever

## 2018-01-31 NOTE — ED Provider Notes (Signed)
[facility] EMERGENCY DEPARTMENT Provider Note   CSN: [id number] Arrival date & time: 01/31/18  2123     History   Chief Complaint Chief Complaint  Patient presents with  . Cough    HPI Curtis Craig is a 62 y.o. male.  Patient complains of productive cough and now has left shoulder pain.  Patient believe his start hurting because he is been coughing a lot  The history is provided by the patient. No language interpreter was used.  Cough  This is a new problem. The current episode started more than 2 days ago. The problem occurs constantly. The problem has not changed since onset.The cough is non-productive. There has been no fever. Pertinent negatives include no chest pain and no headaches. He has tried nothing for the symptoms. The treatment provided no relief. He is not a smoker.    Past Medical History:  Diagnosis Date  . Gall bladder stones   . History of kidney stones     There are no active problems to display for this patient.   Past Surgical History:  Procedure Laterality Date  . BACK SURGERY     lumbar  . CHOLECYSTECTOMY    . CYSTOSCOPY     stone extraction  . INGUINAL HERNIA REPAIR Right 08/13/2015   Procedure: RIGHT INGUINAL HERNIORRHAPHY WITH MESH;  Surgeon: Franky Macho, MD;  Location: [facility];  Service: General;  Laterality: Right;  . INSERTION OF MESH Right 08/13/2015   Procedure: INSERTION OF MESH RIGHT INGUINAL HERNIORRHAPY;  Surgeon: Franky Macho, MD;  Location: [facility];  Service: General;  Laterality: Right;  . SMALL INTESTINE SURGERY          Home Medications    Prior to Admission medications   Medication Sig Start Date End Date Taking? Authorizing Provider  naproxen sodium (ALEVE) 220 MG tablet Take 220-440 mg by mouth daily as needed (for pain).   Yes [provider]  doxycycline (VIBRAMYCIN) 100 MG capsule Take 1 capsule (100 mg total) by mouth 2 (two) times daily. One po bid x 7 days 01/31/18   Bethann Berkshire, MD  traMADol (ULTRAM)  50 MG tablet Take 1 tablet (50 mg total) by mouth every 6 (six) hours as needed. 01/31/18   Bethann Berkshire, MD    Family History No family history on file.  Social History Social History   Tobacco Use  . Smoking status: Former Smoker    Packs/day: 0.25    Years: 1.00    Pack years: 0.25    Types: Cigarettes    Last attempt to quit: 08/08/1982    Years since quitting: 35.5  . Smokeless tobacco: Never Used  Substance Use Topics  . Alcohol use: Yes    Comment: rare  . Drug use: No     Allergies   Patient has no known allergies.   Review of Systems Review of Systems  Constitutional: Negative for appetite change and fatigue.  HENT: Negative for congestion, ear discharge and sinus pressure.   Eyes: Negative for discharge.  Respiratory: Positive for cough.   Cardiovascular: Negative for chest pain.  Gastrointestinal: Negative for abdominal pain and diarrhea.  Genitourinary: Negative for frequency and hematuria.  Musculoskeletal: Negative for back pain.  Skin: Negative for rash.  Neurological: Negative for seizures and headaches.  Psychiatric/Behavioral: Negative for hallucinations.     Physical Exam Updated Vital Signs BP (!) 158/91 (BP Location: Right Arm)   Temp 98.6 F (37 C) (Oral)   Resp 20   Ht 5'  8" (1.727 m)   Wt 86.6 kg (191 lb)   SpO2 100%   BMI 29.04 kg/m   Physical Exam  Constitutional: He is oriented to person, place, and time. He appears well-developed.  HENT:  Head: Normocephalic.  Eyes: Conjunctivae and EOM are normal. No scleral icterus.  Neck: Neck supple. No thyromegaly present.  Cardiovascular: Normal rate and regular rhythm. Exam reveals no gallop and no friction rub.  No murmur heard. Pulmonary/Chest: No stridor. He has no wheezes. He has no rales. He exhibits no tenderness.  Abdominal: He exhibits no distension. There is no tenderness. There is no rebound.  Musculoskeletal: Normal range of motion. He exhibits no edema.    Lymphadenopathy:    He has no cervical adenopathy.  Neurological: He is oriented to person, place, and time. He exhibits normal muscle tone. Coordination normal.  Skin: No rash noted. No erythema.  Psychiatric: He has a normal mood and affect. His behavior is normal.     ED Treatments / Results  Labs (all labs ordered are listed, but only abnormal results are displayed) Labs Reviewed - No data to display  EKG None  Radiology Dg Chest 2 View  Result Date: 01/31/2018 CLINICAL DATA:  Cough, congestion, and runny nose since yesterday. EXAM: CHEST - 2 VIEW COMPARISON:  10/04/2017 FINDINGS: Normal heart size and pulmonary vascularity. No focal airspace disease or consolidation in the lungs. No blunting of costophrenic angles. No pneumothorax. Mediastinal contours appear intact. IMPRESSION: No active cardiopulmonary disease. Electronically Signed   By: Burman Nieves M.D.   On: 01/31/2018 22:37   Dg Shoulder Left  Result Date: 01/31/2018 CLINICAL DATA:  Left arm pain with movement since yesterday. Cough and congestion. EXAM: LEFT SHOULDER - 2+ VIEW COMPARISON:  12/19/2015 FINDINGS: Degenerative changes demonstrated in the acromioclavicular and glenohumeral joints. No evidence of acute fracture or dislocation of the left shoulder. No focal bone lesion or bone destruction. Soft tissues are unremarkable. 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 No acute bony abnormalities. Electronically Signed   By: Burman Nieves M.D.   On: 01/31/2018 22:36    Procedures Procedures (including critical care time)  Medications Ordered in ED Medications  doxycycline (VIBRA-TABS) tablet 100 mg (has no administration in time range)  HYDROmorphone (DILAUDID) injection 1 mg (1 mg Intramuscular Given 01/31/18 2306)     Initial Impression / Assessment and Plan / ED Course  I have reviewed the triage vital signs and the nursing notes.  Pertinent labs & imaging results that were available  during my care of the patient were reviewed by me and considered in my medical decision making (see chart for details).     Chest x-ray shows no pneumonia.  Left shoulder does show some degenerative changes.  Patient placed on doxycycline and Ultram and will follow-up with PCP  Final Clinical Impressions(s) / ED Diagnoses   Final diagnoses:  Cough  Bronchitis    ED Discharge Orders        Ordered    doxycycline (VIBRAMYCIN) 100 MG capsule  2 times daily     01/31/18 2329    traMADol (ULTRAM) 50 MG tablet  Every 6 hours PRN     01/31/18 2329       Bethann Berkshire, MD 01/31/18 2333

## 2018-10-08 IMAGING — DX DG SHOULDER 2+V*L*
3 series · 3 of 3 positions shown · non-contrast
Comparison: 12/19/2015

CLINICAL DATA: Left arm pain with movement since yesterday. Cough
and congestion.

EXAM:
LEFT SHOULDER - 2+ VIEW

[shoulder grashey]
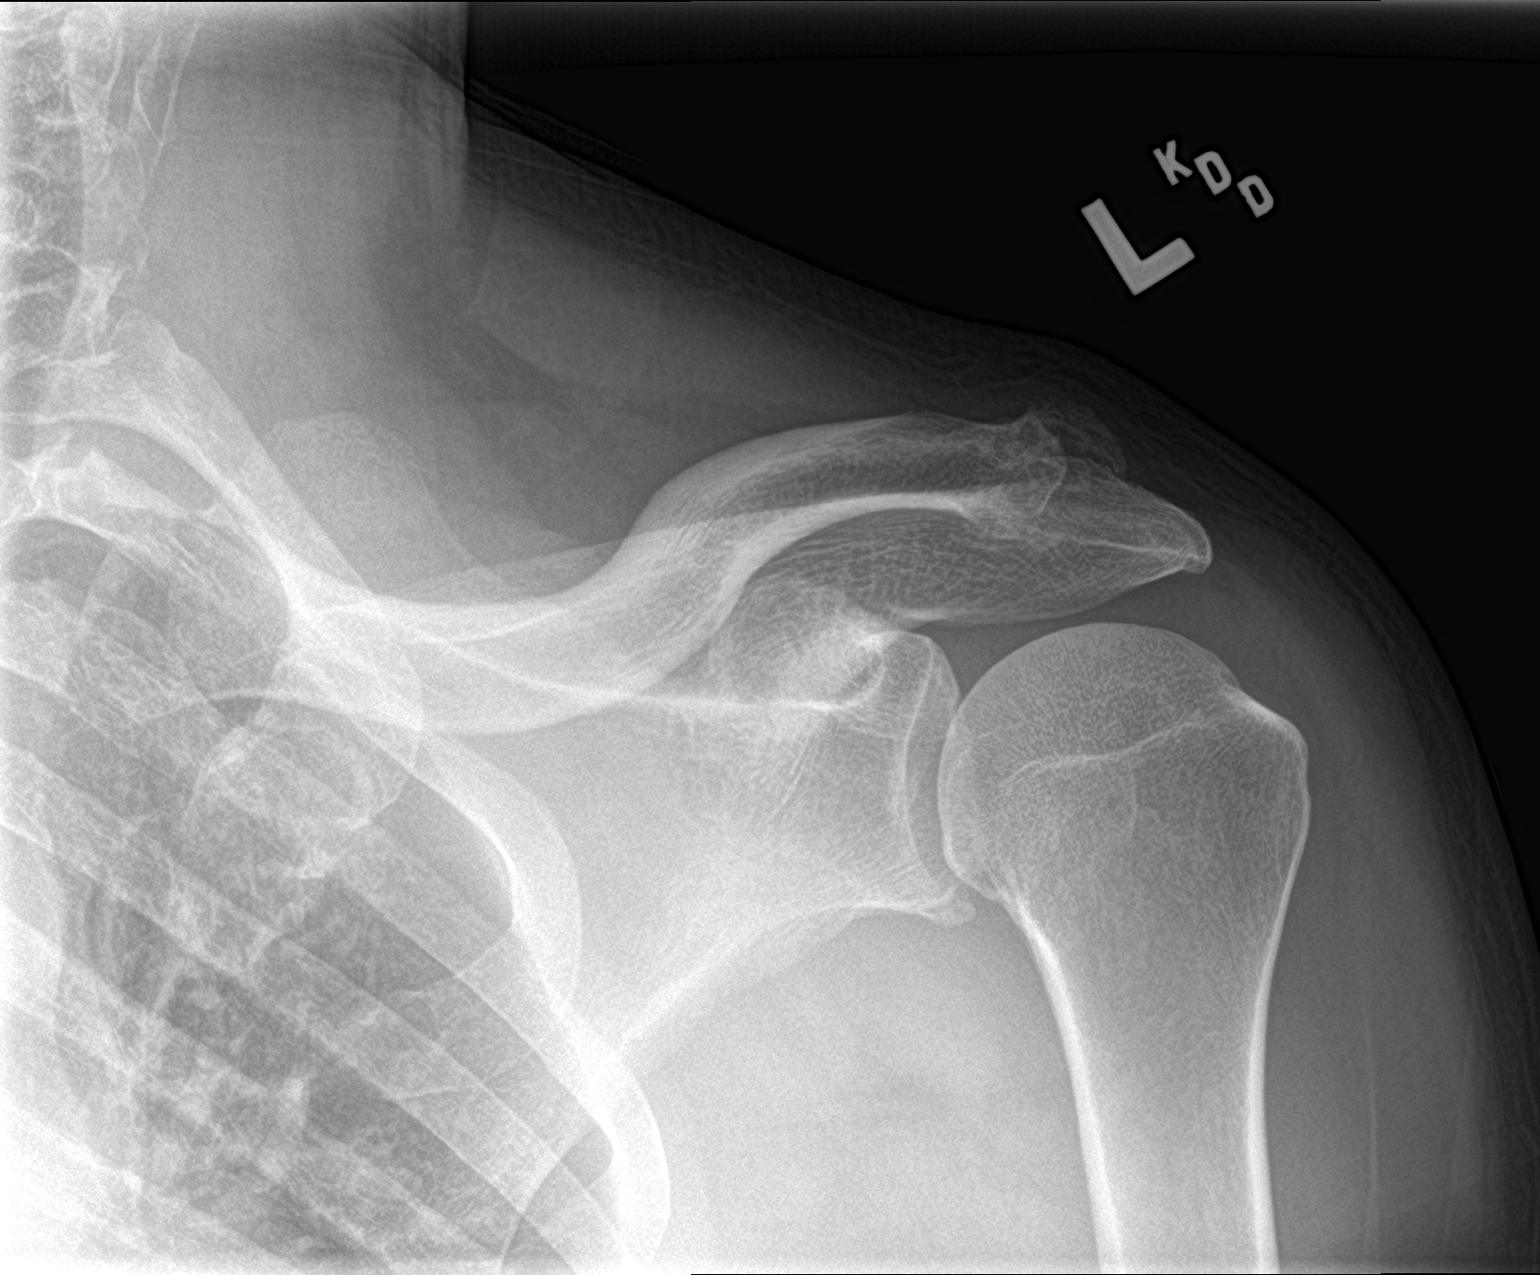

[shoulder y view]
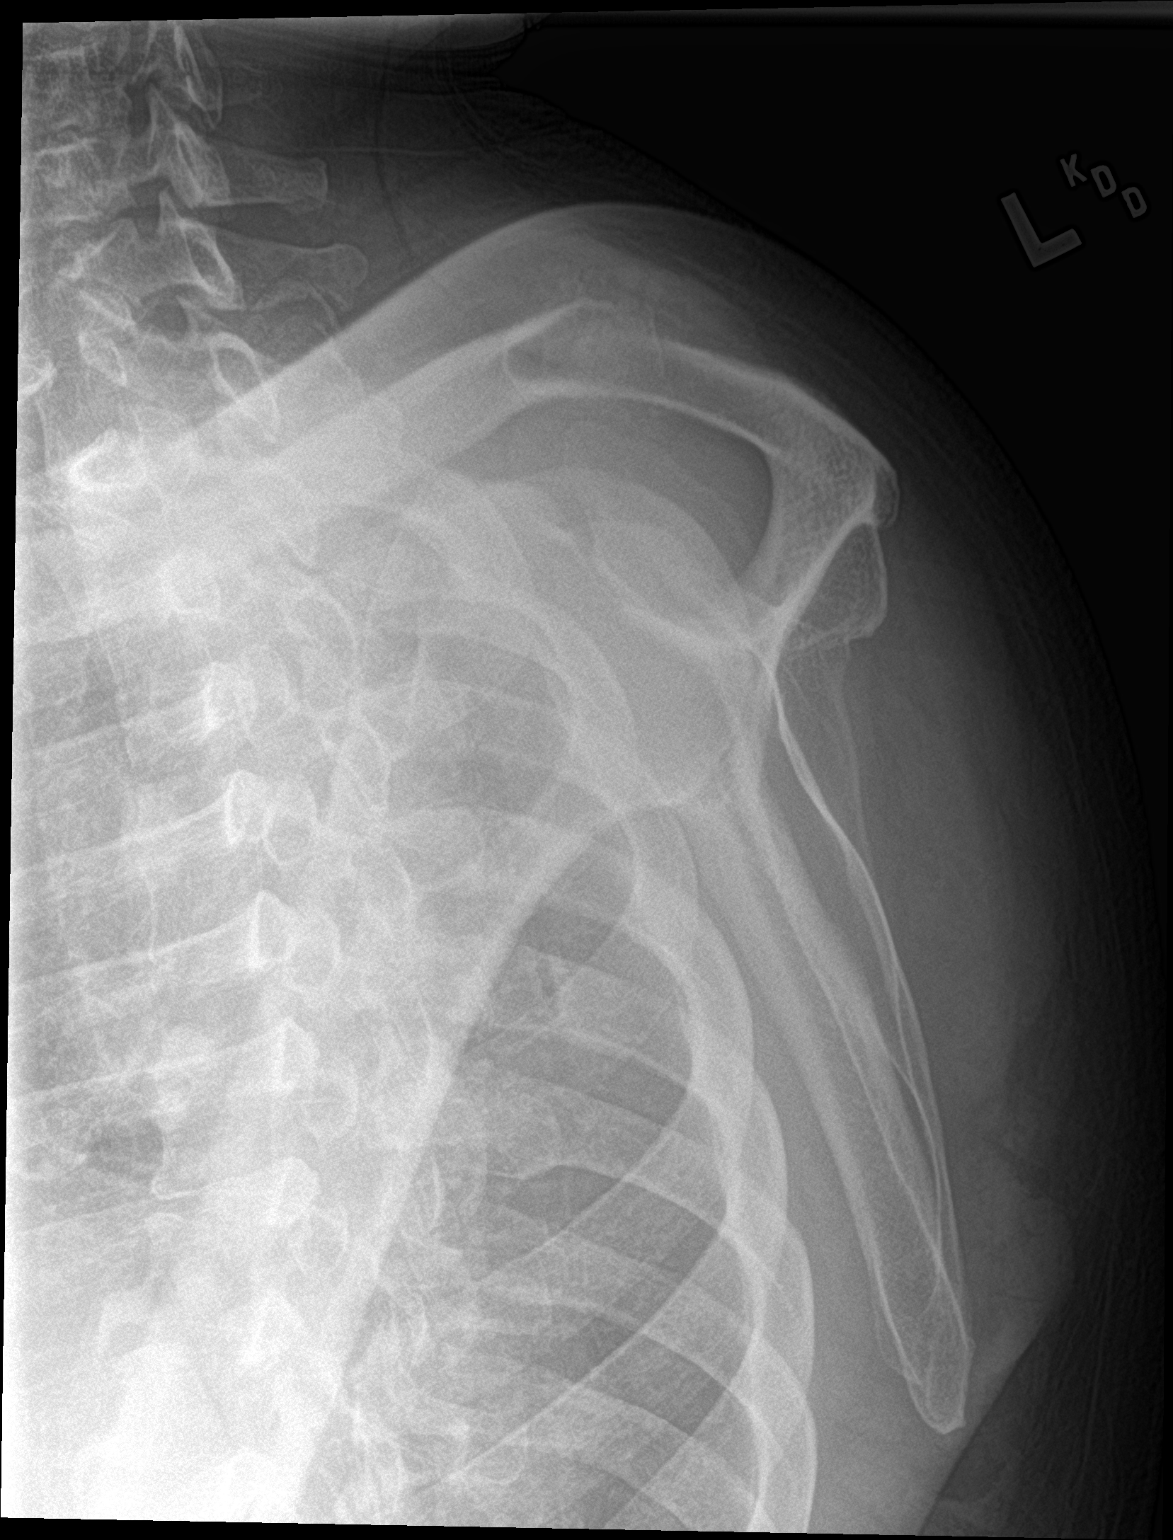

[shoulder axillary]
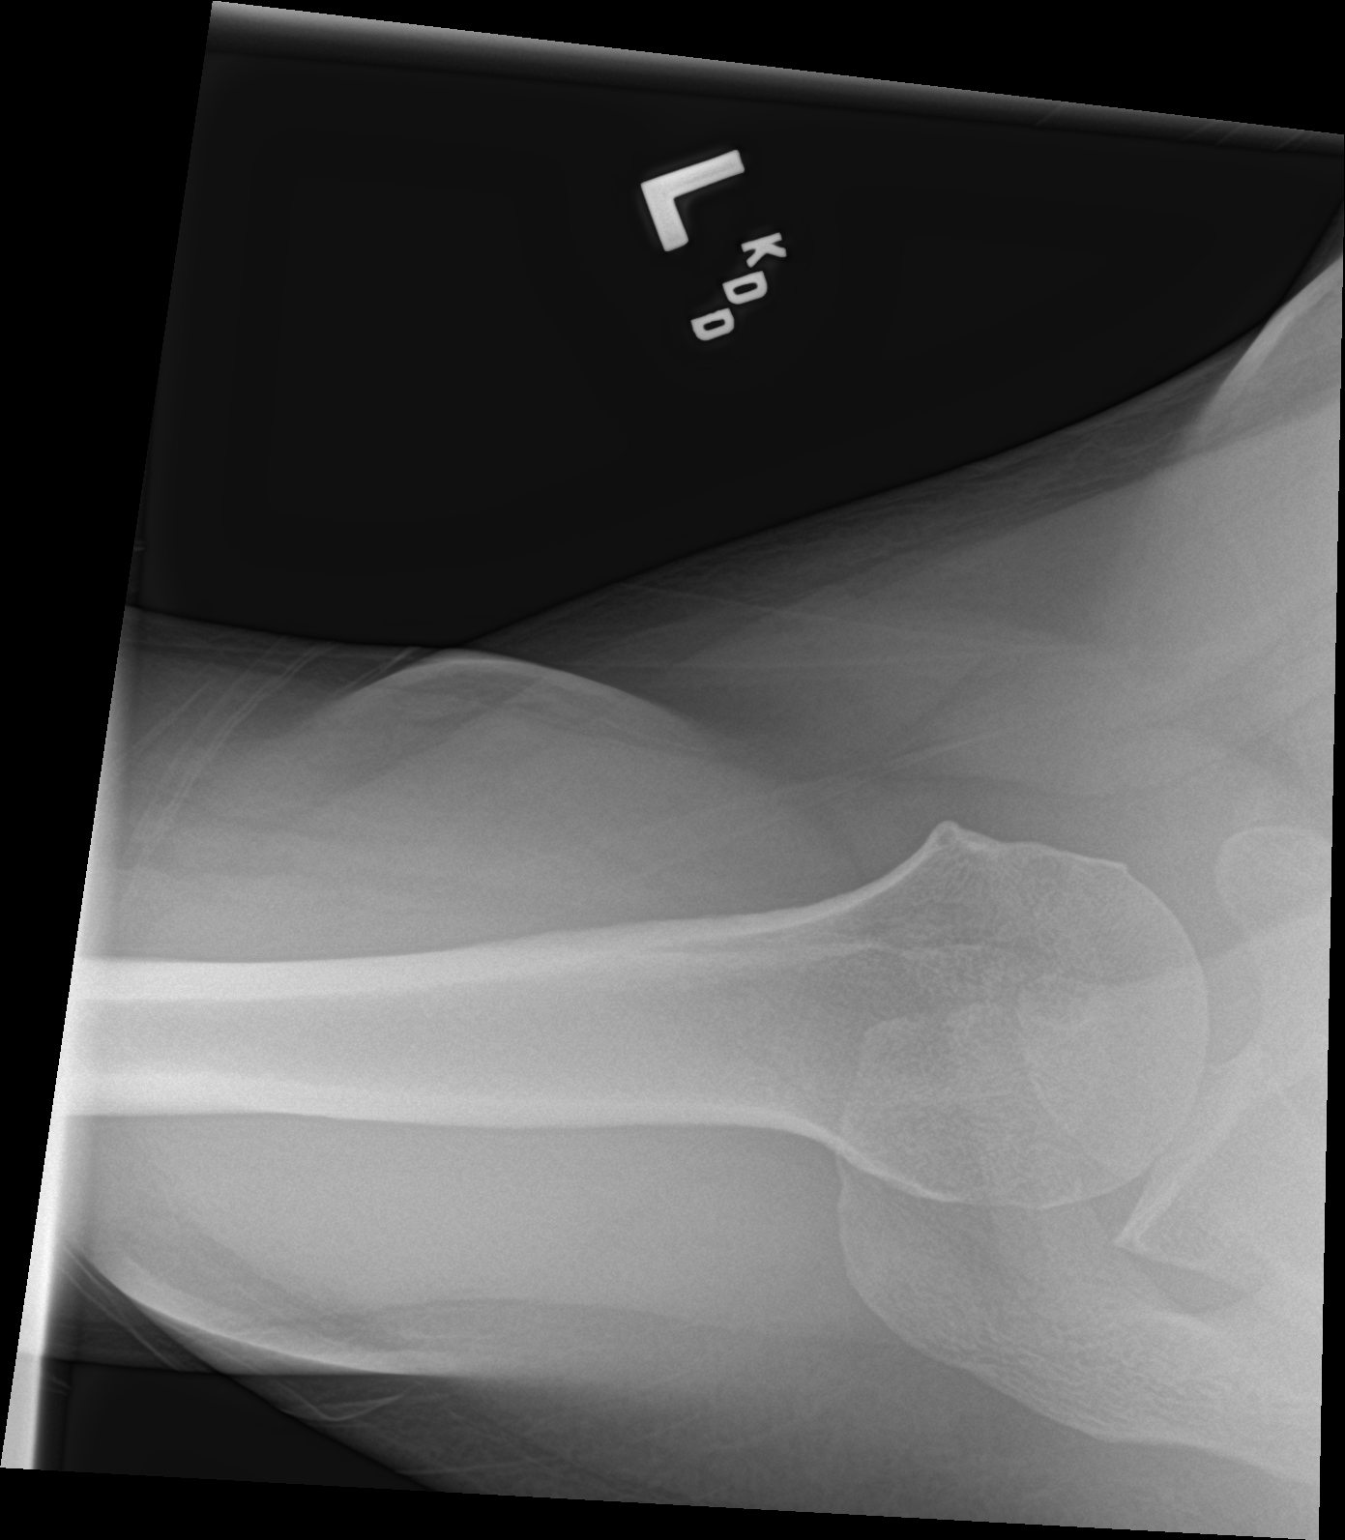

[3 of 3 positions shown; findings below may reference images not displayed]

FINDINGS: Degenerative changes demonstrated in the acromioclavicular and
glenohumeral joints. No evidence of acute fracture or dislocation of
the left shoulder. No focal bone lesion or bone destruction. Soft
tissues are unremarkable.
IMPRESSION: Degenerative changes in the left shoulder. No acute bony
abnormalities.

## 2018-10-08 IMAGING — DX DG CHEST 2V
2 series · 2 of 2 positions shown · non-contrast
Comparison: 10/04/2017

CLINICAL DATA: Cough, congestion, and runny nose since yesterday.

EXAM:
CHEST - 2 VIEW

[chest pa]
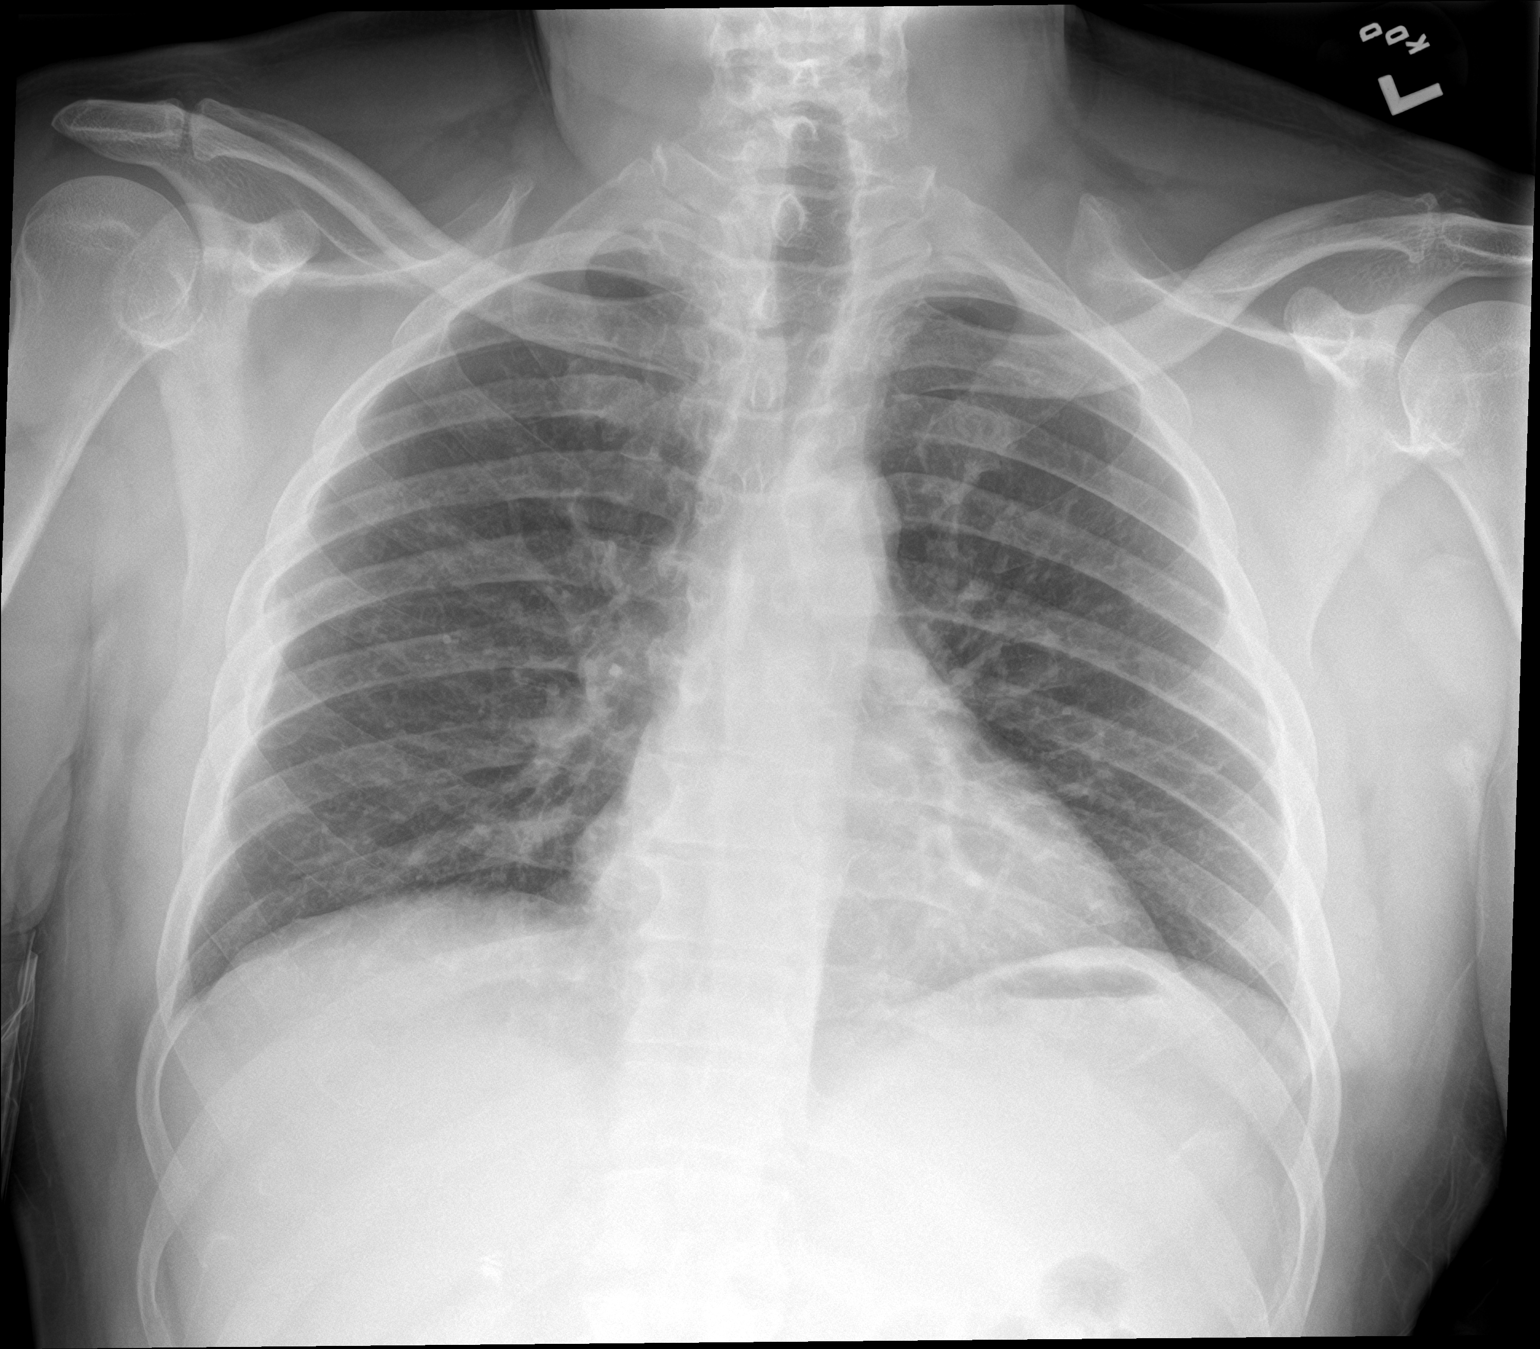

[chest lat]
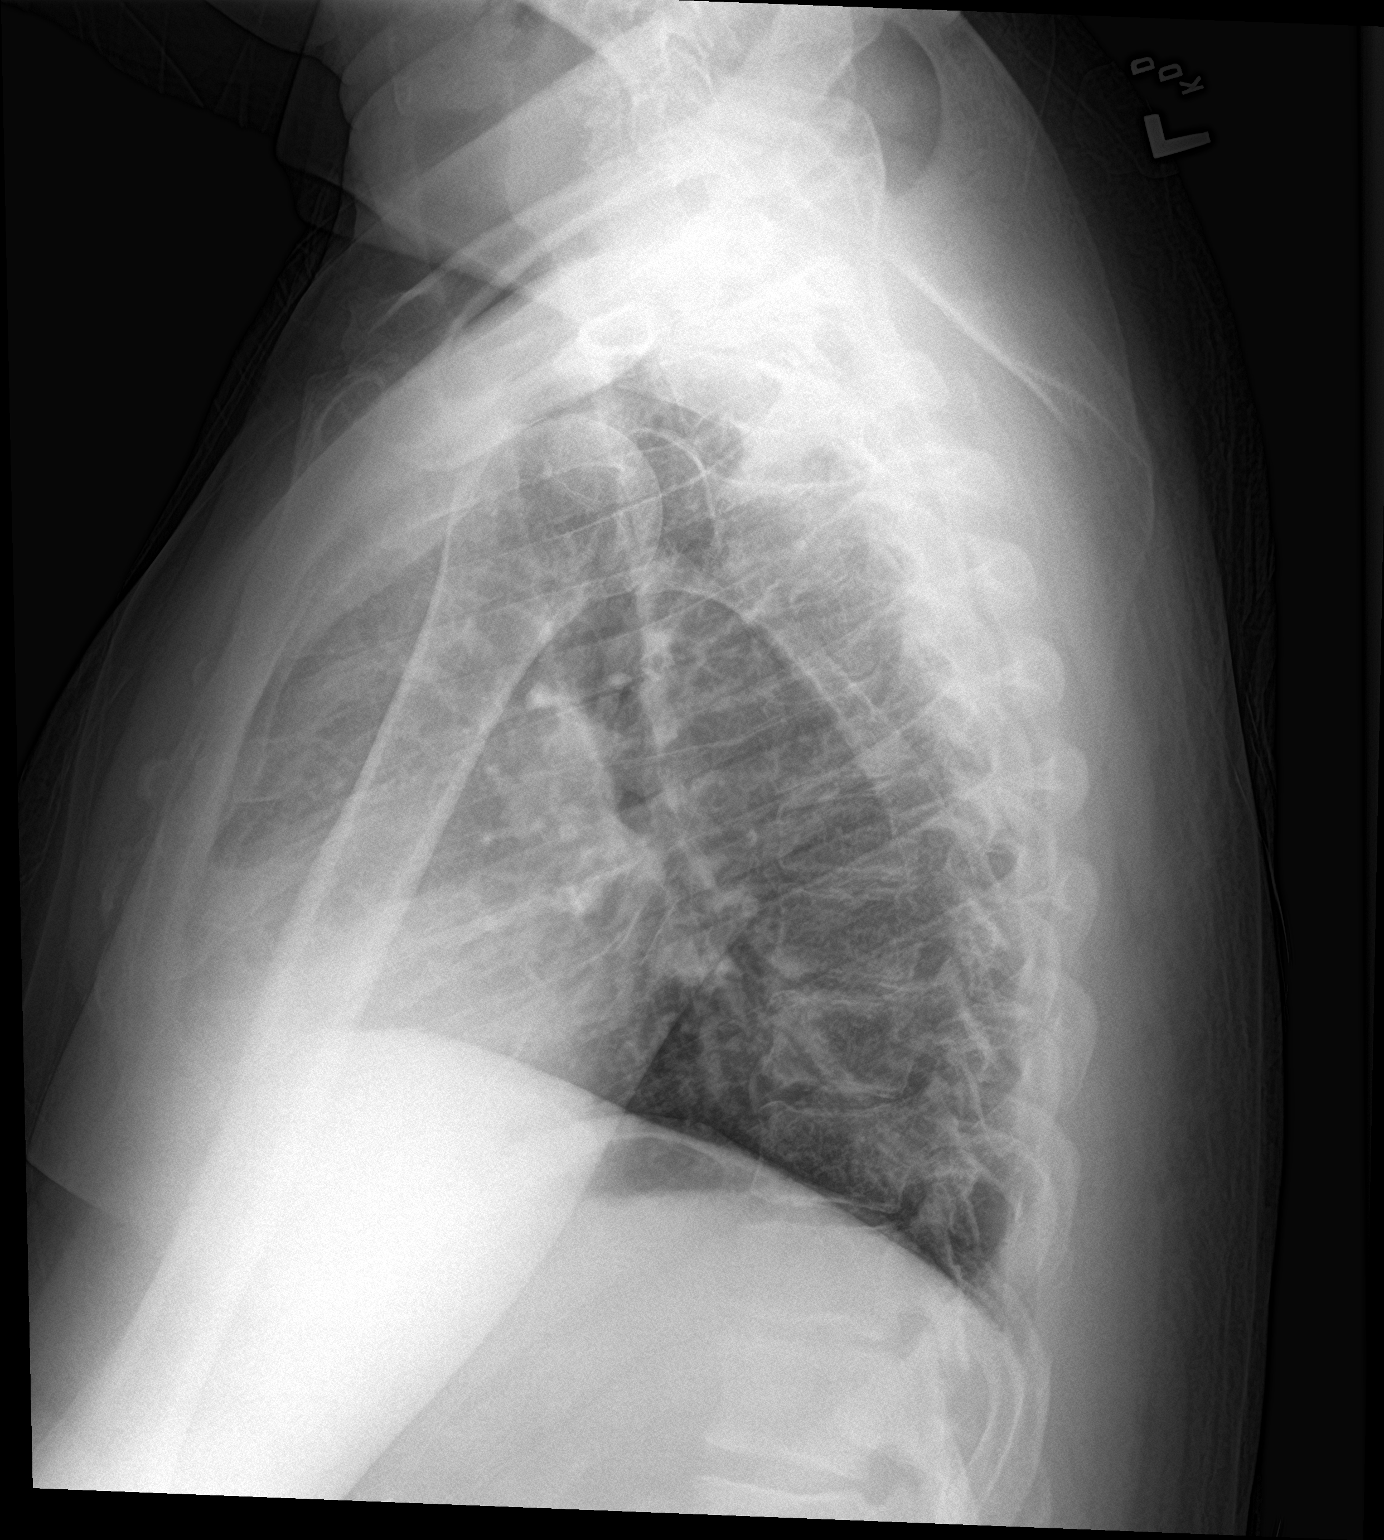

[2 of 2 positions shown; findings below may reference images not displayed]

FINDINGS: Normal heart size and pulmonary vascularity. No focal airspace
disease or consolidation in the lungs. No blunting of costophrenic
angles. No pneumothorax. Mediastinal contours appear intact.
IMPRESSION: No active cardiopulmonary disease.

## 2019-06-09 DEATH — deceased
